# Patient Record
Sex: Male | Born: 1990 | Race: Black or African American | Hispanic: No | Marital: Single | Smoking: Current every day smoker
Health system: Southern US, Community
[De-identification: ages and names within clinical notes are randomized; demographics above are authoritative.]

---
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
## 2011-04-20 ENCOUNTER — Emergency Department: Payer: Self-pay | Admitting: Emergency Medicine

## 2011-12-26 ENCOUNTER — Emergency Department: Payer: Self-pay | Admitting: Emergency Medicine

## 2011-12-26 LAB — COMPREHENSIVE METABOLIC PANEL
Anion Gap: 11 (ref 7–16)
Calcium, Total: 8.6 mg/dL (ref 8.5–10.1)
Chloride: 105 mmol/L (ref 98–107)
Co2: 26 mmol/L (ref 21–32)
EGFR (African American): >60
EGFR (Non-African Amer.): >60
SGOT(AST): 48 U/L — ABNORMAL HIGH (ref 15–37)
SGPT (ALT): 23 U/L
Total Protein: 7.4 g/dL (ref 6.4–8.2)

## 2011-12-26 LAB — CBC
HCT: 44 % (ref 40.0–52.0)
MCHC: 33.3 g/dL (ref 32.0–36.0)
Platelet: 185 10*3/uL (ref 150–440)
RBC: 4.85 10*6/uL (ref 4.40–5.90)
RDW: 12.7 % (ref 11.5–14.5)

## 2011-12-26 LAB — TROPONIN I: Troponin-I: <0.02 ng/mL

## 2012-01-30 ENCOUNTER — Emergency Department: Payer: Self-pay | Admitting: Emergency Medicine

## 2012-02-05 ENCOUNTER — Emergency Department: Payer: Self-pay | Admitting: Emergency Medicine

## 2012-02-21 ENCOUNTER — Emergency Department: Payer: Self-pay | Admitting: Unknown Physician Specialty

## 2014-10-14 ENCOUNTER — Emergency Department: Payer: Self-pay | Admitting: Emergency Medicine

## 2014-10-14 LAB — COMPREHENSIVE METABOLIC PANEL
ALK PHOS: 103 U/L
ANION GAP: 5 — AB (ref 7–16)
Albumin: 3.9 g/dL (ref 3.4–5.0)
BILIRUBIN TOTAL: 0.3 mg/dL (ref 0.2–1.0)
BUN: 11 mg/dL (ref 7–18)
Calcium, Total: 8.8 mg/dL (ref 8.5–10.1)
Chloride: 106 mmol/L (ref 98–107)
Co2: 27 mmol/L (ref 21–32)
Creatinine: 1.02 mg/dL (ref 0.60–1.30)
EGFR (African American): >60
EGFR (Non-African Amer.): >60
Glucose: 89 mg/dL (ref 65–99)
Osmolality: 275 (ref 275–301)
POTASSIUM: 4 mmol/L (ref 3.5–5.1)
SGOT(AST): 41 U/L — ABNORMAL HIGH (ref 15–37)
SGPT (ALT): 22 U/L
SODIUM: 138 mmol/L (ref 136–145)
Total Protein: 7.4 g/dL (ref 6.4–8.2)

## 2014-10-14 LAB — URINALYSIS, COMPLETE
BACTERIA: NONE SEEN
BILIRUBIN, UR: NEGATIVE
Blood: NEGATIVE
Glucose,UR: NEGATIVE mg/dL (ref 0–75)
Ketone: NEGATIVE
Leukocyte Esterase: NEGATIVE
Nitrite: NEGATIVE
PH: 7 (ref 4.5–8.0)
Protein: NEGATIVE
RBC,UR: 1 /HPF (ref 0–5)
Specific Gravity: 1.023 (ref 1.003–1.030)
Squamous Epithelial: NONE SEEN
WBC UR: <1 /HPF (ref 0–5)

## 2014-10-14 LAB — CBC WITH DIFFERENTIAL/PLATELET
Basophil #: 0 10*3/uL (ref 0.0–0.1)
Basophil %: 0.8 %
EOS ABS: 0.1 10*3/uL (ref 0.0–0.7)
Eosinophil %: 2.2 %
HCT: 40.2 % (ref 40.0–52.0)
HGB: 13.2 g/dL (ref 13.0–18.0)
LYMPHS PCT: 30.3 %
Lymphocyte #: 1.7 10*3/uL (ref 1.0–3.6)
MCH: 28.9 pg (ref 26.0–34.0)
MCHC: 32.8 g/dL (ref 32.0–36.0)
MCV: 88 fL (ref 80–100)
MONO ABS: 0.5 x10 3/mm (ref 0.2–1.0)
MONOS PCT: 9.6 %
NEUTROS ABS: 3.2 10*3/uL (ref 1.4–6.5)
Neutrophil %: 57.1 %
PLATELETS: 233 10*3/uL (ref 150–440)
RBC: 4.55 10*6/uL (ref 4.40–5.90)
RDW: 12.8 % (ref 11.5–14.5)
WBC: 5.5 10*3/uL (ref 3.8–10.6)

## 2014-10-14 LAB — LIPASE, BLOOD: Lipase: 171 U/L (ref 73–393)

## 2016-10-29 ENCOUNTER — Emergency Department
Admission: EM | Admit: 2016-10-29 | Discharge: 2016-10-30 | Disposition: A | Discharge status: Home / Self Care | Payer: Self-pay | Attending: Emergency Medicine | Admitting: Emergency Medicine

## 2016-10-29 DIAGNOSIS — Z5181 Encounter for therapeutic drug level monitoring: Secondary | ICD-10-CM | POA: Insufficient documentation

## 2016-10-29 DIAGNOSIS — Y939 Activity, unspecified: Secondary | ICD-10-CM | POA: Insufficient documentation

## 2016-10-29 DIAGNOSIS — F17200 Nicotine dependence, unspecified, uncomplicated: Secondary | ICD-10-CM | POA: Insufficient documentation

## 2016-10-29 DIAGNOSIS — Y999 Unspecified external cause status: Secondary | ICD-10-CM | POA: Insufficient documentation

## 2016-10-29 DIAGNOSIS — T483X2A Poisoning by antitussives, intentional self-harm, initial encounter: Secondary | ICD-10-CM | POA: Insufficient documentation

## 2016-10-29 DIAGNOSIS — F4325 Adjustment disorder with mixed disturbance of emotions and conduct: Secondary | ICD-10-CM

## 2016-10-29 DIAGNOSIS — Y9289 Other specified places as the place of occurrence of the external cause: Secondary | ICD-10-CM | POA: Insufficient documentation

## 2016-10-29 DIAGNOSIS — X58XXXA Exposure to other specified factors, initial encounter: Secondary | ICD-10-CM | POA: Insufficient documentation

## 2016-10-29 DIAGNOSIS — T50902A Poisoning by unspecified drugs, medicaments and biological substances, intentional self-harm, initial encounter: Secondary | ICD-10-CM

## 2016-10-29 DIAGNOSIS — F321 Major depressive disorder, single episode, moderate: Secondary | ICD-10-CM | POA: Insufficient documentation

## 2016-10-29 DIAGNOSIS — T450X1A Poisoning by antiallergic and antiemetic drugs, accidental (unintentional), initial encounter: Secondary | ICD-10-CM

## 2016-10-29 LAB — CBC WITH DIFFERENTIAL/PLATELET
BASOS PCT: 2 %
Basophils Absolute: 0.2 10*3/uL — ABNORMAL HIGH (ref 0–0.1)
Eosinophils Absolute: 0.2 10*3/uL (ref 0–0.7)
Eosinophils Relative: 3 %
HEMATOCRIT: 40.5 % (ref 40.0–52.0)
HEMOGLOBIN: 13.8 g/dL (ref 13.0–18.0)
Lymphocytes Relative: 26 %
Lymphs Abs: 2.2 10*3/uL (ref 1.0–3.6)
MCH: 29.9 pg (ref 26.0–34.0)
MCHC: 34.1 g/dL (ref 32.0–36.0)
MCV: 87.6 fL (ref 80.0–100.0)
MONOS PCT: 7 %
Monocytes Absolute: 0.6 10*3/uL (ref 0.2–1.0)
NEUTROS ABS: 5.2 10*3/uL (ref 1.4–6.5)
NEUTROS PCT: 62 %
Platelets: 268 10*3/uL (ref 150–440)
RBC: 4.63 MIL/uL (ref 4.40–5.90)
RDW: 13.2 % (ref 11.5–14.5)
WBC: 8.3 10*3/uL (ref 3.8–10.6)

## 2016-10-29 MED ORDER — SODIUM CHLORIDE 0.9 % IV BOLUS (SEPSIS)
1000.0000 mL | Freq: Once | INTRAVENOUS | Status: AC
  Administered 2016-10-30: 1000 mL via INTRAVENOUS

## 2016-10-29 NOTE — ED Triage Notes (Signed)
Patient was found unresponsive at a playground [address].Patient administered narcan by FD on scene but ammonia capsules woke patient up. Patient admits to one whole bottle of Nyquil in an attempt to harm himself.

## 2016-10-30 ENCOUNTER — Emergency Department: Payer: Self-pay

## 2016-10-30 DIAGNOSIS — F4325 Adjustment disorder with mixed disturbance of emotions and conduct: Secondary | ICD-10-CM

## 2016-10-30 DIAGNOSIS — T450X1A Poisoning by antiallergic and antiemetic drugs, accidental (unintentional), initial encounter: Secondary | ICD-10-CM

## 2016-10-30 LAB — COMPREHENSIVE METABOLIC PANEL
ALT: 22 U/L (ref 17–63)
ANION GAP: 8 (ref 5–15)
AST: 40 U/L (ref 15–41)
Albumin: 4.3 g/dL (ref 3.5–5.0)
Alkaline Phosphatase: 105 U/L (ref 38–126)
BUN: 15 mg/dL (ref 6–20)
CHLORIDE: 104 mmol/L (ref 101–111)
CO2: 27 mmol/L (ref 22–32)
Calcium: 9.1 mg/dL (ref 8.9–10.3)
Creatinine, Ser: 0.92 mg/dL (ref 0.61–1.24)
GFR calc non Af Amer: >60 mL/min (ref >60)
Glucose, Bld: 87 mg/dL (ref 65–99)
POTASSIUM: 3.5 mmol/L (ref 3.5–5.1)
Sodium: 139 mmol/L (ref 135–145)
Total Bilirubin: 0.4 mg/dL (ref 0.3–1.2)
Total Protein: 7.1 g/dL (ref 6.5–8.1)

## 2016-10-30 LAB — BLOOD GAS, ARTERIAL
ACID-BASE DEFICIT: 0.2 mmol/L (ref 0.0–2.0)
Bicarbonate: 24.8 mmol/L (ref 20.0–28.0)
FIO2: 0.21
O2 Saturation: 98.3 %
PATIENT TEMPERATURE: 37
pCO2 arterial: 41 mmHg (ref 32.0–48.0)
pH, Arterial: 7.39 (ref 7.350–7.450)
pO2, Arterial: 111 mmHg — ABNORMAL HIGH (ref 83.0–108.0)

## 2016-10-30 LAB — URINE DRUG SCREEN, QUALITATIVE (ARMC ONLY)
Amphetamines, Ur Screen: NOT DETECTED
BENZODIAZEPINE, UR SCRN: NOT DETECTED
Barbiturates, Ur Screen: NOT DETECTED
COCAINE METABOLITE, UR ~~LOC~~: NOT DETECTED
Cannabinoid 50 Ng, Ur ~~LOC~~: NOT DETECTED
MDMA (Ecstasy)Ur Screen: NOT DETECTED
Methadone Scn, Ur: NOT DETECTED
OPIATE, UR SCREEN: NOT DETECTED
PHENCYCLIDINE (PCP) UR S: NOT DETECTED
Tricyclic, Ur Screen: NOT DETECTED

## 2016-10-30 LAB — ACETAMINOPHEN LEVEL
ACETAMINOPHEN (TYLENOL), SERUM: 15 ug/mL (ref 10–30)
ACETAMINOPHEN (TYLENOL), SERUM: 35 ug/mL — AB (ref 10–30)

## 2016-10-30 LAB — TROPONIN I

## 2016-10-30 LAB — SALICYLATE LEVEL: Salicylate Lvl: <7 mg/dL (ref 2.8–30.0)

## 2016-10-30 LAB — ETHANOL

## 2016-10-30 NOTE — ED Notes (Signed)
Pt dc to home he lives with his mother and his girlfriend picked him up

## 2016-10-30 NOTE — ED Notes (Signed)
Poison Control called to get a follow up.

## 2016-10-30 NOTE — Discharge Instructions (Signed)
Only take medications as prescribed and return to the emergency room if you have any thoughts of hurting herself or others or any new or worrisome symptoms

## 2016-10-30 NOTE — ED Provider Notes (Signed)
[facility] Emergency Department Provider Note   ____________________________________________   First MD Initiated Contact with Patient 10/29/16 2348     (approximate)  I have reviewed the triage vital signs and the nursing notes.   HISTORY  Chief Complaint Ingestion    HPI Annie Mainevonshe J Wnek is a 26 y.o. male brought to the ED from a playground via EMS with a chief complaint of unresponsive secondary to intentional ingestion.Patient was found unresponsive outside at a playground in his apartment complex. He was administered Narcan by first responders which was not effective, but ammonia capsules did revive the patient. Patient admits to drinking an entire large bottle of NyQuil in an attempt to harm himself. Reports being overwhelmed and "stressed out". No prior history of same. Voices no medical complaints other than drowsiness. Denies recent travel or trauma.   Past medical history None  There are no active problems to display for this patient.   History reviewed. No pertinent surgical history.  Prior to Admission medications   Not on File    Allergies Patient has no known allergies.  No family history on file.  Social History Social History  Substance Use Topics  . Smoking status: Current Every Day Smoker  . Smokeless tobacco: Not on file  . Alcohol use Yes    Review of Systems  Constitutional: No fever/chills. Eyes: No visual changes. ENT: No sore throat. Cardiovascular: Denies chest pain. Respiratory: Denies shortness of breath. Gastrointestinal: No abdominal pain.  No nausea, no vomiting.  No diarrhea.  No constipation. Genitourinary: Negative for dysuria. Musculoskeletal: Negative for back pain. Skin: Negative for rash. Neurological: Negative for headaches, focal weakness or numbness. Psychiatric:Positive for depression with suicide attempt. Negative for HI/AH/VH.  10-point ROS otherwise  negative.  ____________________________________________   PHYSICAL EXAM:  VITAL SIGNS: ED Triage Vitals  Enc Vitals Group     BP 10/29/16 2344 124/77     Pulse Rate 10/29/16 2344 80     Resp 10/29/16 2344 14     Temp 10/29/16 2344 98.3 F (36.8 C)     Temp Source 10/29/16 2344 Oral     SpO2 10/29/16 2344 100 %     Weight 10/29/16 2345 163 lb (73.9 kg)     Height 10/29/16 2345 5\' 7"  (1.702 m)     Head Circumference --      Peak Flow --      Pain Score 10/29/16 2345 0     Pain Loc --      Pain Edu? --      Excl. in GC? --     Constitutional: Drowsy but arousable to voice. Well appearing and in no acute distress. Eyes: Conjunctivae are normal. PERRL. EOMI. Head: Atraumatic. Nose: No congestion/rhinnorhea. Mouth/Throat: Mucous membranes are moist.  Oropharynx non-erythematous. Neck: No stridor.  No cervical spine tenderness to palpation. Cardiovascular: Normal rate, regular rhythm. Grossly normal heart sounds.  Good peripheral circulation. Respiratory: Normal respiratory effort.  No retractions. Lungs CTAB. Gastrointestinal: Soft and nontender. No distention. No abdominal bruits. No CVA tenderness. Musculoskeletal: No lower extremity tenderness nor edema.  No joint effusions. Neurologic:  Normal speech and language. No gross focal neurologic deficits are appreciated. Skin:  Skin is warm, dry and intact. No rash noted. Psychiatric: Mood and affect are flat. Speech and behavior are normal.  ____________________________________________   LABS (all labs ordered are listed, but only abnormal results are displayed)  Labs Reviewed  CBC WITH DIFFERENTIAL/PLATELET - Abnormal; Notable for the following:  Result Value   Basophils Absolute 0.2 (*)    All other components within normal limits  BLOOD GAS, ARTERIAL - Abnormal; Notable for the following:    pO2, Arterial 111 (*)    All other components within normal limits  ACETAMINOPHEN LEVEL - Abnormal; Notable for the  following:    Acetaminophen (Tylenol), Serum 35 (*)    All other components within normal limits  COMPREHENSIVE METABOLIC PANEL  ETHANOL  ACETAMINOPHEN LEVEL  SALICYLATE LEVEL  TROPONIN I  SALICYLATE LEVEL  URINE DRUG SCREEN, QUALITATIVE ([facility] ONLY)   ____________________________________________  EKG  ED ECG REPORT I, Brigett Estell J, the attending physician, personally viewed and interpreted this ECG.   Date: 10/30/2016  EKG Time: 2347  Rate: 82  Rhythm: normal EKG, normal sinus rhythm  Axis: Normal  Intervals:none  ST&T Change: Global ST elevation suggestive of pericarditis  ED ECG REPORT I, Shaylin Blatt J, the attending physician, personally viewed and interpreted this ECG.   Date: 10/30/2016  EKG Time: 0648  Rate: 70  Rhythm: normal EKG, normal sinus rhythm  Axis: Normal  Intervals:none  ST&T Change: Global ST elevation Unchanged from prior  ____________________________________________  RADIOLOGY  Portable chest xray (viewed by me, interpreted per Dr. Andria Meuse): No active disease. ____________________________________________   PROCEDURES  Procedure(s) performed: None  Procedures  Critical Care performed: No  ____________________________________________   INITIAL IMPRESSION / ASSESSMENT AND PLAN / ED COURSE  Pertinent labs & imaging results that were available during my care of the patient were reviewed by me and considered in my medical decision making (see chart for details).  26 year old male who presents with intentional overdose of NyQuil. He is sleepy but arousable to voice and answers questions appropriately. EKG demonstrates mild global ST elevation suggestive of acute pericarditis. This does not fit the patient's clinical presentation. Will add troponin, repeat EKG. Nurse to contact poison control. Will initiate IV fluid resuscitation, obtain screening toxicological lab work, place patient under involuntary commitment for his safety and contact TTS  as well as psychiatry to evaluate patient in the emergency department.  Clinical Course as of Oct 30 712  Mon Oct 30, 2016  0114 Patient resting in no acute distress. Hemodynamically stable. Initial laboratory results are unremarkable. Will repeat timed four-hour acetaminophen and salicylate levels.  [JS]  0520 Spoke with Poison Control regarding increasing acetaminophen level (which remains nontoxic). No significant clinical concern, would not recommend starting NAC nor repeating level. Patient has not vomited and remains sleeping in no acute distress.  [JS]  0713 Repeat EKG remains unchanged. Troponin was negative. At this time patient is medically cleared for psychiatric disposition.  [JS]    Clinical Course User Index [JS] Irean Hong, MD     ____________________________________________   FINAL CLINICAL IMPRESSION(S) / ED DIAGNOSES  Final diagnoses:  Moderate single current episode of major depressive disorder (HCC)  Intentional drug overdose, initial encounter (HCC)      NEW MEDICATIONS STARTED DURING THIS VISIT:  New Prescriptions   No medications on file     Note:  This document was prepared using Dragon voice recognition software and may include unintentional dictation errors.    Irean Hong, MD 10/30/16 [phone]

## 2016-10-30 NOTE — ED Provider Notes (Addendum)
-----------------------------------------   1:26 PM on 10/30/2016 ----------------------------------------- patient was signed out to me this morning by Dr. sung as medically cleared. He was evaluated by psychiatry they do request the patient be discharged. No SI or HI, or percussions followed given and understood patient counseled about his dangerous behavior. Also discussed with poison control, they did run by the toxicologist &  feel no further intervention is needed. Screening EKG was obtained.  Rate 76 bpm early repolarization abnormality, normal sinus rhythm, normal axis no acute ST elevation or depression aside from repolarization abnormality.   Jeanmarie PlantJames A McShane, MD 10/30/16 1327    Jeanmarie PlantJames A McShane, MD 10/30/16 1343    Jeanmarie PlantJames A McShane, MD 10/30/16 [phone]

## 2016-10-30 NOTE — ED Notes (Signed)
[facility] he is pleasant and cooperative and in no acute distress

## 2016-10-30 NOTE — ED Notes (Signed)
IVC  RESCINDED  PER  DR  CLAPACS 

## 2016-10-30 NOTE — ED Notes (Signed)
Spoke with poison control per MD request

## 2016-10-30 NOTE — Consult Note (Signed)
[facility] Consult   Reason for Consult:  Consult for 26 year old man brought to the emergency room after being found passed out outdoors Referring Physician:  Jimmye Norman Patient Identification: Steven Tucker MRN:  [id number] Principal Diagnosis: Adjustment disorder with mixed disturbance of emotions and conduct Diagnosis:   Patient Active Problem List   Diagnosis Date Noted  . Adjustment disorder with mixed disturbance of emotions and conduct [F43.25] 10/30/2016  . Diphenhydramine overdose [T45.0X1A] 10/30/2016    Total Time spent with patient: 1 hour  Subjective:   Steven Tucker is a 26 y.o. male patient admitted with "I was really just trying to get to sleep.".  HPI:  Information from the patient and the chart. 26 year old man was brought to the emergency room passed out last night in a park outdoors. Patient tells me that he drank a bottle of NyQuil in an attempt to get to sleep because he couldn't fall asleep. He says that he is been feeling sorry for himself recently. He was at his mother's place and it sounds like there may have been a little bit of a confrontation there. He denies having any wish to kill himself or thought about wanting to die or any thought of hurting anyone else. Patient denies that he's been drinking or abusing any other drugs recently. He just got out of prison in October. Has been facing what sounds like the usual struggle since then. Doesn't report any specific physical problems. Doesn't report any psychotic symptoms. Just feels like he's a little stressed out.  Medical history: Patient denies having any active medical problems  Social history: Recently out of prison. Ongoing financially. Living with his father currently in [place] like that's an okay living situation. He was at his mother's house when this happened yesterday.  Substance abuse history: Reports that he smoked marijuana years ago when he was younger but is not currently  using any alcohol or drugs.  Past Psychiatric History: Denies any past psychiatric history. No history of suicide attempts. No history of psychiatric hospitalization. Not been on any psychiatric medicine.  Risk to Self: Suicidal Ideation: Yes-Currently Present Suicidal Intent: Yes-Currently Present Is patient at risk for suicide?: No (Currently in the hospital) Suicidal Plan?: No-Not Currently/Within Last 6 Months Access to Means: Yes Specify Access to Suicidal Means: Over the counter medication What has been your use of drugs/alcohol within the last 12 months?: denied use How many times?: 0 Other Self Harm Risks: denied Triggers for Past Attempts: Other (Comment) (Stressor) Intentional Self Injurious Behavior: None Risk to Others: Homicidal Ideation: No Thoughts of Harm to Others: No Current Homicidal Intent: No Current Homicidal Plan: No Access to Homicidal Means: No Identified Victim: None identified History of harm to others?: No Assessment of Violence: None Noted Does patient have access to weapons?: No Criminal Charges Pending?: No Does patient have a court date: No Prior Inpatient Therapy: Prior Inpatient Therapy: No Prior Therapy Dates: n/a Prior Therapy Facilty/Provider(s): n/a Reason for Treatment: n/a Prior Outpatient Therapy: Prior Outpatient Therapy: No Prior Therapy Dates: n/a Prior Therapy Facilty/Provider(s): n/a Reason for Treatment: n/a Does patient have an ACCT team?: No Does patient have Intensive In-House Services?  : No Does patient have [facility] services? : No Does patient have P4CC services?: No  Past Medical History: History reviewed. No pertinent past medical history. History reviewed. No pertinent surgical history. Family History: No family history on file. Family Psychiatric  History: Does not know of any family history of mental health problems Social  History:  History  Alcohol Use  . Yes     History  Drug use: Unknown    Social History    Social History  . Marital status: Single    Spouse name: N/A  . Number of children: N/A  . Years of education: N/A   Social History Main Topics  . Smoking status: Current Every Day Smoker  . Smokeless tobacco: None  . Alcohol use Yes  . Drug use: Unknown  . Sexual activity: Not Asked   Other Topics Concern  . None   Social History Narrative  . None   Additional Social History:    Allergies:  No Known Allergies  Labs:  Results for orders placed or performed during the hospital encounter of 10/29/16 (from the past 48 hour(s))  CBC with Differential     Status: Abnormal   Collection Time: 10/29/16 11:47 PM  Result Value Ref Range   WBC 8.3 3.8 - 10.6 K/uL   RBC 4.63 4.40 - 5.90 MIL/uL   Hemoglobin 13.8 13.0 - 18.0 g/dL   HCT 40.5 40.0 - 52.0 %   MCV 87.6 80.0 - 100.0 fL   MCH 29.9 26.0 - 34.0 pg   MCHC 34.1 32.0 - 36.0 g/dL   RDW 13.2 11.5 - 14.5 %   Platelets 268 150 - 440 K/uL   Neutrophils Relative % 62 %   Neutro Abs 5.2 1.4 - 6.5 K/uL   Lymphocytes Relative 26 %   Lymphs Abs 2.2 1.0 - 3.6 K/uL   Monocytes Relative 7 %   Monocytes Absolute 0.6 0.2 - 1.0 K/uL   Eosinophils Relative 3 %   Eosinophils Absolute 0.2 0 - 0.7 K/uL   Basophils Relative 2 %   Basophils Absolute 0.2 (H) 0 - 0.1 K/uL  Comprehensive metabolic panel     Status: None   Collection Time: 10/29/16 11:47 PM  Result Value Ref Range   Sodium 139 135 - 145 mmol/L   Potassium 3.5 3.5 - 5.1 mmol/L   Chloride 104 101 - 111 mmol/L   CO2 27 22 - 32 mmol/L   Glucose, Bld 87 65 - 99 mg/dL   BUN 15 6 - 20 mg/dL   Creatinine, Ser 0.92 0.61 - 1.24 mg/dL   Calcium 9.1 8.9 - 10.3 mg/dL   Total Protein 7.1 6.5 - 8.1 g/dL   Albumin 4.3 3.5 - 5.0 g/dL   AST 40 15 - 41 U/L   ALT 22 17 - 63 U/L   Alkaline Phosphatase 105 38 - 126 U/L   Total Bilirubin 0.4 0.3 - 1.2 mg/dL   GFR calc non Af Amer >60 >60 mL/min   GFR calc Af Amer >60 >60 mL/min    Comment: (NOTE) The eGFR has been calculated using the  CKD EPI equation. This calculation has not been validated in all clinical situations. eGFR's persistently <60 mL/min signify possible Chronic Kidney Disease.    Anion gap 8 5 - 15  Ethanol     Status: None   Collection Time: 10/29/16 11:47 PM  Result Value Ref Range   Alcohol, Ethyl (B) <5 <5 mg/dL    Comment:        LOWEST DETECTABLE LIMIT FOR SERUM ALCOHOL IS 5 mg/dL FOR MEDICAL PURPOSES ONLY   Acetaminophen level     Status: None   Collection Time: 10/29/16 11:47 PM  Result Value Ref Range   Acetaminophen (Tylenol), Serum 15 10 - 30 ug/mL    Comment:  THERAPEUTIC CONCENTRATIONS VARY SIGNIFICANTLY. A RANGE OF 10-30 ug/mL MAY BE AN EFFECTIVE CONCENTRATION FOR MANY PATIENTS. HOWEVER, SOME ARE BEST TREATED AT CONCENTRATIONS OUTSIDE THIS RANGE. ACETAMINOPHEN CONCENTRATIONS >150 ug/mL AT 4 HOURS AFTER INGESTION AND >50 ug/mL AT 12 HOURS AFTER INGESTION ARE OFTEN ASSOCIATED WITH TOXIC REACTIONS.   Salicylate level     Status: None   Collection Time: 10/29/16 11:47 PM  Result Value Ref Range   Salicylate Lvl <7.0 2.8 - 30.0 mg/dL  Troponin I     Status: None   Collection Time: 10/29/16 11:47 PM  Result Value Ref Range   Troponin I <0.03 <0.03 ng/mL  Blood gas, arterial     Status: Abnormal   Collection Time: 10/30/16  1:03 AM  Result Value Ref Range   FIO2 0.21    pH, Arterial 7.39 7.350 - 7.450   pCO2 arterial 41 32.0 - 48.0 mmHg   pO2, Arterial 111 (H) 83.0 - 108.0 mmHg   Bicarbonate 24.8 20.0 - 28.0 mmol/L   Acid-base deficit 0.2 0.0 - 2.0 mmol/L   O2 Saturation 98.3 %   Patient temperature 37.0    Collection site RIGHT RADIAL    Sample type ARTERIAL DRAW    Allens test (pass/fail) PASS PASS  Urine Drug Screen, Qualitative     Status: None   Collection Time: 10/30/16  1:55 AM  Result Value Ref Range   Tricyclic, Ur Screen NONE DETECTED NONE DETECTED   Amphetamines, Ur Screen NONE DETECTED NONE DETECTED   MDMA (Ecstasy)Ur Screen NONE DETECTED NONE  DETECTED   Cocaine Metabolite,Ur [place] NONE DETECTED NONE DETECTED   Opiate, Ur Screen NONE DETECTED NONE DETECTED   Phencyclidine (PCP) Ur S NONE DETECTED NONE DETECTED   Cannabinoid 50 Ng, Ur [place] NONE DETECTED NONE DETECTED   Barbiturates, Ur Screen NONE DETECTED NONE DETECTED   Benzodiazepine, Ur Scrn NONE DETECTED NONE DETECTED   Methadone Scn, Ur NONE DETECTED NONE DETECTED    Comment: (NOTE) 100  Tricyclics, urine               Cutoff 1000 ng/mL 200  Amphetamines, urine             Cutoff 1000 ng/mL 300  MDMA (Ecstasy), urine           Cutoff 500 ng/mL 400  Cocaine Metabolite, urine       Cutoff 300 ng/mL 500  Opiate, urine                   Cutoff 300 ng/mL 600  Phencyclidine (PCP), urine      Cutoff 25 ng/mL 700  Cannabinoid, urine              Cutoff 50 ng/mL 800  Barbiturates, urine             Cutoff 200 ng/mL 900  Benzodiazepine, urine           Cutoff 200 ng/mL 1000 Methadone, urine                Cutoff 300 ng/mL 1100 1200 The urine drug screen provides only a preliminary, unconfirmed 1300 analytical test result and should not be used for non-medical 1400 purposes. Clinical consideration and professional judgment should 1500 be applied to any positive drug screen result due to possible 1600 interfering substances. A more specific alternate chemical method 1700 must be used in order to obtain a confirmed analytical result.  1800 Gas chromato graphy / mass spectrometry (GC/MS) is the preferred 1900  confirmatory method.   Acetaminophen level     Status: Abnormal   Collection Time: 10/30/16  4:13 AM  Result Value Ref Range   Acetaminophen (Tylenol), Serum 35 (H) 10 - 30 ug/mL    Comment:        THERAPEUTIC CONCENTRATIONS VARY SIGNIFICANTLY. A RANGE OF 10-30 ug/mL MAY BE AN EFFECTIVE CONCENTRATION FOR MANY PATIENTS. HOWEVER, SOME ARE BEST TREATED AT CONCENTRATIONS OUTSIDE THIS RANGE. ACETAMINOPHEN CONCENTRATIONS >150 ug/mL AT 4 HOURS AFTER INGESTION AND >50 ug/mL AT  12 HOURS AFTER INGESTION ARE OFTEN ASSOCIATED WITH TOXIC REACTIONS.   Salicylate level     Status: None   Collection Time: 10/30/16  4:13 AM  Result Value Ref Range   Salicylate Lvl <2.7 2.8 - 30.0 mg/dL    No current facility-administered medications for this encounter.    No current outpatient prescriptions on file.    Musculoskeletal: Strength & Muscle Tone: within normal limits Gait & Station: normal Patient leans: N/A  Psychiatric Specialty Exam: Physical Exam  Nursing note and vitals reviewed. Constitutional: He appears well-developed and well-nourished.  HENT:  Head: Normocephalic and atraumatic.  Eyes: Conjunctivae are normal. Pupils are equal, round, and reactive to light.  Neck: Normal range of motion.  Cardiovascular: Regular rhythm and normal heart sounds.   Respiratory: Effort normal. No respiratory distress.  GI: Soft.  Musculoskeletal: Normal range of motion.  Neurological: He is alert.  Skin: Skin is warm and dry.  Psychiatric: He has a normal mood and affect. His speech is normal and behavior is normal. Judgment and thought content normal. Cognition and memory are normal.    Review of Systems  Constitutional: Negative.   HENT: Negative.   Eyes: Negative.   Respiratory: Negative.   Cardiovascular: Negative.   Gastrointestinal: Negative.   Musculoskeletal: Negative.   Skin: Negative.   Neurological: Negative.   Psychiatric/Behavioral: Negative.     Blood pressure 115/71, pulse 70, temperature 98.3 F (36.8 C), temperature source Oral, resp. rate 11, height '5\' 7"'$  (1.702 m), weight 73.9 kg (163 lb), SpO2 95 %.Body mass index is 25.53 kg/m.  General Appearance: Casual  Eye Contact:  Good  Speech:  Normal Rate  Volume:  Normal  Mood:  Euthymic  Affect:  Constricted  Thought Process:  Goal Directed  Orientation:  Full (Time, Place, and Person)  Thought Content:  Logical  Suicidal Thoughts:  No  Homicidal Thoughts:  No  Memory:  Immediate;    Good Recent;   Fair Remote;   Fair  Judgement:  Fair  Insight:  Fair  Psychomotor Activity:  Normal  Concentration:  Concentration: Fair  Recall:  [employer] of Knowledge:  Fair  Language:  Fair  Akathisia:  No  Handed:  Right  AIMS (if indicated):     Assets:  Communication Skills Desire for Improvement Housing Physical Health Resilience Social Support  ADL's:  Intact  Cognition:  WNL  Sleep:        Treatment Plan Summary: Plan 26 year old man took an overdose of diphenhydramine in [place] yesterday. Reports that it was an attempt to go to sleep. Affect today is euthymic. No evidence psychosis. Clearly denies having any suicidal thoughts at all. Able to articulate positive thoughts about his current situation and plans for the future. Patient does not require hospital level treatment. Discontinue IVC and he can be discharged with a referral to possible therapy follow-up at [facility].  Disposition: Patient does not meet criteria for psychiatric inpatient admission. Supportive therapy provided about ongoing stressors.  Alethia Berthold, MD 10/30/2016 12:03 PM

## 2016-10-30 NOTE — BH Assessment (Signed)
Assessment Note  Steven Tucker is an 26 y.o. male. Mr. Reamer arrived to the ED after he reportedly drank an entire bottle of Nyquil.  He reports that he is  "tired".  He states that he is stressed at this time.  He shared that he is feeling overwhelmed by his parole, family, and various other life stressors.  He states "I was having a pity party".  He states that he is mad at himself for giving up.  He reported that he was arguing with his family and that he feels misunderstood.  He reports that he is feeling depressed and that he worries a lot.  He shared that he worries even when he is having fun.  He denied symptoms of anxiety.  He denied having auditory or visual hallucinations.  He denied homicidal ideation or intent.  He states that he no longer wants to harm himself and can't believe "that I tripped out like that".  Diagnosis: Depression, SI  Past Medical History: History reviewed. No pertinent past medical history.  History reviewed. No pertinent surgical history.  Family History: No family history on file.  Social History:  reports that he has been smoking.  He does not have any smokeless tobacco history on file. He reports that he drinks alcohol. His drug history is not on file.  Additional Social History:  Alcohol / Drug Use History of alcohol / drug use?: No history of alcohol / drug abuse  CIWA: CIWA-Ar BP: 128/71 Pulse Rate: 91 COWS:    Allergies: No Known Allergies  Home Medications:  (Not in a hospital admission)  OB/GYN Status:  No LMP for male patient.  General Assessment Data Location of Assessment: [facility] ED TTS Assessment: In system Is this a Tele or Face-to-Face Assessment?: Face-to-Face Is this an Initial Assessment or a Re-assessment for this encounter?: Initial Assessment Marital status: Single Maiden name: n/a Is patient pregnant?: No Pregnancy Status: No Living Arrangements: Parent (Dad) Can pt return to current living arrangement?: Yes Admission  Status: Involuntary Is patient capable of signing voluntary admission?: Yes Referral Source: Self/Family/Friend Insurance type: Unsure  Medical Screening Exam [facility] Walk-in ONLY) Medical Exam completed: Yes  Crisis Care Plan Living Arrangements: Parent (Dad) Legal Guardian: Other: (Self) Name of Psychiatrist: None Name of Therapist: None  Education Status Is patient currently in school?: No Current Grade: n/a Highest grade of school patient has completed: 12th Name of school: [employer] person: n/a  Risk to self with the past 6 months Suicidal Ideation: Yes-Currently Present Has patient been a risk to self within the past 6 months prior to admission? : Yes Suicidal Intent: Yes-Currently Present Has patient had any suicidal intent within the past 6 months prior to admission? : Yes Is patient at risk for suicide?: No (Currently in the hospital) Suicidal Plan?: No-Not Currently/Within Last 6 Months Has patient had any suicidal plan within the past 6 months prior to admission? : Yes Access to Means: Yes Specify Access to Suicidal Means: Over the counter medication What has been your use of drugs/alcohol within the last 12 months?: denied use Previous Attempts/Gestures: No How many times?: 0 Other Self Harm Risks: denied Triggers for Past Attempts: Other (Comment) (Stressor) Intentional Self Injurious Behavior: None Family Suicide History: No Recent stressful life event(s): Legal Issues, Financial Problems, Conflict (Comment) (arguing with family) Persecutory voices/beliefs?: No Depression: Yes Depression Symptoms: Feeling worthless/self pity Substance abuse history and/or treatment for substance abuse?: No Suicide prevention information given to non-admitted patients: Not applicable  Risk to Others within the past 6 months Homicidal Ideation: No Does patient have any lifetime risk of violence toward others beyond the six months prior to admission? : No Thoughts of Harm  to Others: No Current Homicidal Intent: No Current Homicidal Plan: No Access to Homicidal Means: No Identified Victim: None identified History of harm to others?: No Assessment of Violence: None Noted Does patient have access to weapons?: No Criminal Charges Pending?: No Does patient have a court date: No Is patient on probation?: Yes (Weapons violation)  Psychosis Hallucinations: None noted Delusions: None noted  Mental Status Report Appearance/Hygiene: Unremarkable Eye Contact: Good Motor Activity: Unremarkable Speech: Logical/coherent Level of Consciousness: Quiet/awake Mood: Depressed Affect: Constricted Anxiety Level: None Thought Processes: Coherent Judgement: Partial Orientation: Person, Place, Situation, Time Obsessive Compulsive Thoughts/Behaviors: None  Cognitive Functioning Concentration: Normal Memory: Recent Intact IQ: Average Insight: Fair Impulse Control: Fair Appetite: Fair Sleep: No Change Vegetative Symptoms: None  ADLScreening [facility] Assessment Services) Patient's cognitive ability adequate to safely complete daily activities?: Yes Patient able to express need for assistance with ADLs?: Yes Independently performs ADLs?: Yes (appropriate for developmental age)  Prior Inpatient Therapy Prior Inpatient Therapy: No Prior Therapy Dates: n/a Prior Therapy Facilty/Provider(s): n/a Reason for Treatment: n/a  Prior Outpatient Therapy Prior Outpatient Therapy: No Prior Therapy Dates: n/a Prior Therapy Facilty/Provider(s): n/a Reason for Treatment: n/a Does patient have an ACCT team?: No Does patient have Intensive In-House Services?  : No Does patient have [facility] services? : No Does patient have P4CC services?: No  ADL Screening (condition at time of admission) Patient's cognitive ability adequate to safely complete daily activities?: Yes Patient able to express need for assistance with ADLs?: Yes Independently performs ADLs?: Yes (appropriate  for developmental age)       Abuse/Neglect Assessment (Assessment to be complete while patient is alone) Physical Abuse: Denies Verbal Abuse: Denies Sexual Abuse: Denies Exploitation of patient/patient's resources: Denies Self-Neglect: Denies          Additional Information 1:1 In Past 12 Months?: No CIRT Risk: No Elopement Risk: No Does patient have medical clearance?: No     Disposition:  Disposition Initial Assessment Completed for this Encounter: Yes Disposition of Patient: Other dispositions  On Site Evaluation by:   Reviewed with Physician:    Justice DeedsKeisha Juliene Kirsh 10/30/2016 12:59 AM

## 2017-03-16 ENCOUNTER — Encounter: Payer: Self-pay | Admitting: Emergency Medicine

## 2017-03-16 ENCOUNTER — Emergency Department
Admission: EM | Admit: 2017-03-16 | Discharge: 2017-03-16 | Disposition: A | Discharge status: Home / Self Care | Payer: Self-pay | Attending: Student in an Organized Health Care Education/Training Program | Admitting: Student in an Organized Health Care Education/Training Program

## 2017-03-16 DIAGNOSIS — S0990XA Unspecified injury of head, initial encounter: Secondary | ICD-10-CM

## 2017-03-16 DIAGNOSIS — Y998 Other external cause status: Secondary | ICD-10-CM | POA: Insufficient documentation

## 2017-03-16 DIAGNOSIS — Y939 Activity, unspecified: Secondary | ICD-10-CM | POA: Insufficient documentation

## 2017-03-16 DIAGNOSIS — W2209XA Striking against other stationary object, initial encounter: Secondary | ICD-10-CM | POA: Insufficient documentation

## 2017-03-16 DIAGNOSIS — S0101XA Laceration without foreign body of scalp, initial encounter: Secondary | ICD-10-CM | POA: Insufficient documentation

## 2017-03-16 DIAGNOSIS — F17200 Nicotine dependence, unspecified, uncomplicated: Secondary | ICD-10-CM | POA: Insufficient documentation

## 2017-03-16 DIAGNOSIS — Y929 Unspecified place or not applicable: Secondary | ICD-10-CM | POA: Insufficient documentation

## 2017-03-16 DIAGNOSIS — S0181XA Laceration without foreign body of other part of head, initial encounter: Secondary | ICD-10-CM

## 2017-03-16 MED ORDER — LIDOCAINE-EPINEPHRINE-TETRACAINE (LET) SOLUTION
3.0000 mL | Freq: Once | NASAL | Status: AC
  Administered 2017-03-16: 3 mL via TOPICAL
  Filled 2017-03-16: qty 3

## 2017-03-16 NOTE — ED Provider Notes (Signed)
[facility] Emergency Department Provider Note    None    (approximate)  I have reviewed the triage vital signs and the nursing notes.   HISTORY  Chief Complaint Laceration    HPI Steven Tucker is a 26 y.o. male presents with small laceration above the left ear that he sustained after walking into a car door roughly 2 hours prior to arrival. There was no LOC. Denies any numbness or tingling. No hearing difficulty. No history of bleeding disorders. He is not on any blood thinners. ambulated with a steady gait. Denies any nausea or vomiting. States his tetanus is up-to-date.   History reviewed. No pertinent past medical history. FMH: noh/o easy bleeding or bruising History reviewed. No pertinent surgical history. Patient Active Problem List   Diagnosis Date Noted  . 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 Diphenhydramine overdose 10/30/2016      Prior to Admission medications   Not on File    Allergies Patient has no known allergies.    Social History Social History  Substance Use Topics  . Smoking status: Current Every Day Smoker  . Smokeless tobacco: Never Used  . Alcohol use Yes    Review of Systems Patient denies headaches, rhinorrhea, blurry vision, numbness, shortness of breath, chest pain, edema, cough, abdominal pain, nausea, vomiting, diarrhea, dysuria, fevers, rashes or hallucinations unless otherwise stated above in HPI. ____________________________________________   PHYSICAL EXAM:  VITAL SIGNS: Vitals:   03/16/17 0122  BP: (!) 111/59  Pulse: 72  Resp: 18  Temp: 98 F (36.7 C)    Constitutional: Alert and oriented. Well appearing and in no acute distress. Eyes: Conjunctivae are normal. PERRL. EOMI. Head: 1 cm superficial laceration above left ear. Nose: No congestion/rhinnorhea. Mouth/Throat: Mucous membranes are moist.  Oropharynx non-erythematous. Neck: No stridor.  Painless ROM. No cervical spine tenderness to palpation Hematological/Lymphatic/Immunilogical: No cervical lymphadenopathy. Cardiovascular: Normal rate, regular rhythm. Grossly normal heart sounds.  Good peripheral circulation. Respiratory: Normal respiratory effort.  No retractions. Lungs CTAB. Gastrointestinal: Soft and nontender. No distention. No abdominal bruits. No CVA tenderness. Genitourinary:  Musculoskeletal: No lower extremity tenderness nor edema.  No joint effusions. Neurologic:  Normal speech and language. No gross focal neurologic deficits are appreciated. No gait instability. Skin:  Skin is warm, dry and intact. No rash noted. Psychiatric: Mood and affect are normal. Speech and behavior are normal.  ____________________________________________   LABS (all labs ordered are listed, but only abnormal results are displayed)  No results found for this or any previous visit (from the past 24 hour(s)). ____________________________________________ ________________________________   PROCEDURES  Procedure(s) performed:  Marland KitchenMarland KitchenLaceration Repair Date/Time: 03/16/2017 4:33 AM Performed by: Willy Eddy Authorized by: Willy Eddy   Consent:    Consent obtained:  Verbal   Consent given by:  Patient Anesthesia (see MAR for exact dosages):    Anesthesia method:  Topical application   Topical anesthetic:  LET Repair type:    Repair type:  Simple Exploration:    Hemostasis achieved with:  LET   Contaminated: no   Treatment:    Area cleansed with:  Betadine   Amount of cleaning:  Standard Skin repair:    Repair method:  Tissue adhesive Approximation:    Approximation:  Close   Vermilion border: well-aligned   Post-procedure details:    Dressing:  Open (no dressing)   Patient tolerance of procedure:  Tolerated well, no immediate complications      Critical Care performed:  o ____________________________________________   INITIAL IMPRESSION / ASSESSMENT AND  PLAN / ED COURSE  Pertinent labs & imaging results that were available during my care of the patient were reviewed by me and considered in my medical decision making (see chart for details).  DDX:   Steven Tucker is a 26 y.o. who presents to the ED with  cm laceration on left scalp above ear.  Denies any other injuries.  NO LOC.  CT head not indicated clinically. Td UTD. VSS. Exam with distal NV intact. No functional tendon deficit, no sensory deficit. No signs of erythema or surrounding infection.   Laceration was explored, irrigated, and repaired without complications. No FB in a bloodless field.    Discussed at length wound care and infection precautions with patient.       ____________________________________________   FINAL CLINICAL IMPRESSION(S) / ED DIAGNOSES  Final diagnoses:  Injury of head, initial encounter  Facial laceration, initial encounter      NEW MEDICATIONS STARTED DURING THIS VISIT:  New Prescriptions   No medications on file     Note:  This document was prepared using Dragon voice recognition software and may include unintentional dictation errors.    Willy Eddyobinson, Takisha Pelle, MD 03/16/17 [phone]

## 2017-03-16 NOTE — ED Notes (Signed)
Reviewed d/c instructions, follow-up care, wound care, adhesive glue care with patient. PT verbalized understanding

## 2017-03-16 NOTE — ED Triage Notes (Signed)
Patient ambulatory to triage with steady gait, without difficulty or distress noted; pt reports hitting head on car door 2hrs PTA; approx 1" lac just above left ear with no active bleeding

## 2017-04-07 ENCOUNTER — Emergency Department
Admission: EM | Admit: 2017-04-07 | Discharge: 2017-04-07 | Disposition: A | Discharge status: Home / Self Care | Payer: Self-pay | Attending: Emergency Medicine | Admitting: Emergency Medicine

## 2017-04-07 ENCOUNTER — Encounter: Payer: Self-pay | Admitting: Emergency Medicine

## 2017-04-07 DIAGNOSIS — W51XXXA Accidental striking against or bumped into by another person, initial encounter: Secondary | ICD-10-CM | POA: Insufficient documentation

## 2017-04-07 DIAGNOSIS — F17200 Nicotine dependence, unspecified, uncomplicated: Secondary | ICD-10-CM | POA: Insufficient documentation

## 2017-04-07 DIAGNOSIS — T148XXA Other injury of unspecified body region, initial encounter: Secondary | ICD-10-CM

## 2017-04-07 DIAGNOSIS — Y999 Unspecified external cause status: Secondary | ICD-10-CM | POA: Insufficient documentation

## 2017-04-07 DIAGNOSIS — L24A9 Irritant contact dermatitis due friction or contact with other specified body fluids: Secondary | ICD-10-CM

## 2017-04-07 DIAGNOSIS — Y92310 Basketball court as the place of occurrence of the external cause: Secondary | ICD-10-CM | POA: Insufficient documentation

## 2017-04-07 DIAGNOSIS — Y9367 Activity, basketball: Secondary | ICD-10-CM | POA: Insufficient documentation

## 2017-04-07 DIAGNOSIS — T7840XA Allergy, unspecified, initial encounter: Secondary | ICD-10-CM

## 2017-04-07 DIAGNOSIS — L259 Unspecified contact dermatitis, unspecified cause: Secondary | ICD-10-CM | POA: Insufficient documentation

## 2017-04-07 MED ORDER — CEPHALEXIN 500 MG PO CAPS: 500.0000 mg | ORAL_CAPSULE | Freq: Four times a day (QID) | ORAL | 0 refills | Status: AC

## 2017-04-07 MED ORDER — CEPHALEXIN 500 MG PO CAPS: 500.0000 mg | ORAL_CAPSULE | Freq: Four times a day (QID) | ORAL | 0 refills | Status: DC

## 2017-04-07 MED ORDER — PREDNISONE 20 MG PO TABS: ORAL_TABLET | ORAL | 0 refills | Status: AC

## 2017-04-07 MED ORDER — PREDNISONE 10 MG PO TABS: ORAL_TABLET | ORAL | 0 refills | Status: DC

## 2017-04-07 NOTE — ED Notes (Signed)
Patient with abrasion noted below right nipple. States he was playing basketball a week and a half ago and was scratched by a fingernail. Wound is surrounded by red raised bumps. Serosanguinous drainage also noted that began last Saturday per patient. Patient reports he has been cleaning wound with alcohol and applied antibiotic ointment for several days but that seemed to make the area worse, so he stopped. Patient denies fever or chills.
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
## 2017-04-07 NOTE — ED Triage Notes (Signed)
Pt to ED via POV stating that approximately 1 week ago he was playing basketball and was scratched on the chest. Pt states that the scratch has gotten worse and he now has a sore on his chest with several small bumps around it. Bumps are draining yellow/clear fluid with foul smell.

## 2017-04-07 NOTE — ED Notes (Signed)
Pt's wound was dressed with a non-adhesive, 4x4 gauze, and silk tape.

## 2017-07-26 ENCOUNTER — Emergency Department
Admission: EM | Admit: 2017-07-26 | Discharge: 2017-07-26 | Disposition: A | Discharge status: Home / Self Care | Payer: No Typology Code available for payment source | Attending: Emergency Medicine | Admitting: Emergency Medicine

## 2017-07-26 ENCOUNTER — Encounter: Payer: Self-pay | Admitting: *Deleted

## 2017-07-26 DIAGNOSIS — S39012A Strain of muscle, fascia and tendon of lower back, initial encounter: Secondary | ICD-10-CM | POA: Insufficient documentation

## 2017-07-26 DIAGNOSIS — Y92410 Unspecified street and highway as the place of occurrence of the external cause: Secondary | ICD-10-CM | POA: Insufficient documentation

## 2017-07-26 DIAGNOSIS — Y9389 Activity, other specified: Secondary | ICD-10-CM | POA: Diagnosis not present

## 2017-07-26 DIAGNOSIS — F17200 Nicotine dependence, unspecified, uncomplicated: Secondary | ICD-10-CM | POA: Diagnosis not present

## 2017-07-26 DIAGNOSIS — S3992XA Unspecified injury of lower back, initial encounter: Secondary | ICD-10-CM | POA: Diagnosis present

## 2017-07-26 DIAGNOSIS — Y999 Unspecified external cause status: Secondary | ICD-10-CM | POA: Diagnosis not present

## 2017-07-26 MED ORDER — NAPROXEN 500 MG PO TABS: 500.0000 mg | ORAL_TABLET | Freq: Two times a day (BID) | ORAL | 0 refills | Status: AC

## 2017-07-26 MED ORDER — NAPROXEN 500 MG PO TABS
500.0000 mg | ORAL_TABLET | Freq: Once | ORAL | Status: AC
  Administered 2017-07-26: 500 mg via ORAL
  Filled 2017-07-26: qty 1
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
## 2017-07-26 NOTE — ED Notes (Signed)
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
## 2017-07-26 NOTE — Discharge Instructions (Signed)
Your exam is essentially normal. Follow-up with [facility] for continued symptoms. Apply ice or moist heat as needed. Take the prescription med as directed.

## 2017-08-01 ENCOUNTER — Encounter: Payer: Self-pay | Admitting: Emergency Medicine

## 2017-08-01 DIAGNOSIS — Z79899 Other long term (current) drug therapy: Secondary | ICD-10-CM | POA: Diagnosis not present

## 2017-08-01 DIAGNOSIS — M549 Dorsalgia, unspecified: Secondary | ICD-10-CM | POA: Insufficient documentation

## 2017-08-01 DIAGNOSIS — S3992XD Unspecified injury of lower back, subsequent encounter: Secondary | ICD-10-CM | POA: Diagnosis present

## 2017-08-01 DIAGNOSIS — Z5321 Procedure and treatment not carried out due to patient leaving prior to being seen by health care provider: Secondary | ICD-10-CM

## 2017-08-01 DIAGNOSIS — F17210 Nicotine dependence, cigarettes, uncomplicated: Secondary | ICD-10-CM | POA: Diagnosis not present

## 2017-08-01 DIAGNOSIS — S39012D Strain of muscle, fascia and tendon of lower back, subsequent encounter: Secondary | ICD-10-CM | POA: Diagnosis not present
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
## 2017-08-02 ENCOUNTER — Emergency Department
Admission: EM | Admit: 2017-08-02 | Discharge: 2017-08-02 | Disposition: A | Discharge status: Home / Self Care | Payer: No Typology Code available for payment source | Source: Home / Self Care

## 2017-08-02 ENCOUNTER — Encounter: Payer: Self-pay | Admitting: Emergency Medicine

## 2017-08-02 ENCOUNTER — Emergency Department
Admission: EM | Admit: 2017-08-02 | Discharge: 2017-08-02 | Disposition: A | Discharge status: Home / Self Care | Payer: No Typology Code available for payment source | Attending: Emergency Medicine | Admitting: Emergency Medicine

## 2017-08-02 ENCOUNTER — Emergency Department: Payer: No Typology Code available for payment source

## 2017-08-02 DIAGNOSIS — Z79899 Other long term (current) drug therapy: Secondary | ICD-10-CM | POA: Insufficient documentation

## 2017-08-02 DIAGNOSIS — S39012D Strain of muscle, fascia and tendon of lower back, subsequent encounter: Secondary | ICD-10-CM | POA: Insufficient documentation

## 2017-08-02 DIAGNOSIS — F17210 Nicotine dependence, cigarettes, uncomplicated: Secondary | ICD-10-CM | POA: Insufficient documentation

## 2017-08-02 MED ORDER — CYCLOBENZAPRINE HCL 10 MG PO TABS: 10.0000 mg | ORAL_TABLET | Freq: Three times a day (TID) | ORAL | 0 refills | Status: AC | PRN

## 2017-08-02 MED ORDER — TRAMADOL HCL 50 MG PO TABS
50.0000 mg | ORAL_TABLET | Freq: Once | ORAL | Status: AC
  Administered 2017-08-02: 50 mg via ORAL
  Filled 2017-08-02: qty 1

## 2017-08-02 MED ORDER — TRAMADOL HCL 50 MG PO TABS: 50.0000 mg | ORAL_TABLET | Freq: Four times a day (QID) | ORAL | 0 refills | Status: AC | PRN

## 2017-08-02 MED ORDER — CYCLOBENZAPRINE HCL 10 MG PO TABS
10.0000 mg | ORAL_TABLET | Freq: Once | ORAL | Status: AC
  Administered 2017-08-02: 10 mg via ORAL
  Filled 2017-08-02: qty 1
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
## 2017-08-02 NOTE — ED Notes (Signed)
No answer when called from lobby 
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
## 2017-08-02 NOTE — ED Notes (Signed)
No answer when called from lobby several times 
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
